# Patient Record
Sex: Female | Born: 1960
Health system: Southern US, Community
[De-identification: ages and names within clinical notes are randomized; demographics above are authoritative.]

---

## 2017-02-04 DIAGNOSIS — J4520 Mild intermittent asthma, uncomplicated: Secondary | ICD-10-CM | POA: Diagnosis not present

## 2017-02-04 DIAGNOSIS — I10 Essential (primary) hypertension: Secondary | ICD-10-CM | POA: Diagnosis not present

## 2017-02-22 DIAGNOSIS — I10 Essential (primary) hypertension: Secondary | ICD-10-CM | POA: Diagnosis not present

## 2017-03-05 ENCOUNTER — Encounter: Payer: Self-pay | Admitting: Nurse Practitioner

## 2017-03-05 ENCOUNTER — Ambulatory Visit: Payer: Self-pay

## 2017-03-05 ENCOUNTER — Ambulatory Visit: Payer: Self-pay | Admitting: Nurse Practitioner

## 2017-03-05 VITALS — BP 115/80 | HR 86 | Temp 97.5°F | Resp 16 | Wt 122.4 lb

## 2017-03-05 DIAGNOSIS — J029 Acute pharyngitis, unspecified: Secondary | ICD-10-CM

## 2017-03-05 DIAGNOSIS — J0110 Acute frontal sinusitis, unspecified: Secondary | ICD-10-CM

## 2017-03-05 MED ORDER — DOXYCYCLINE HYCLATE 100 MG PO TABS: 100.0000 mg | ORAL_TABLET | Freq: Two times a day (BID) | ORAL | 0 refills | Status: DC

## 2017-03-05 NOTE — Patient Instructions (Signed)

## 2017-03-05 NOTE — Progress Notes (Signed)
   Subjective:    Patient ID: Jacqueline Wilkins, female    DOB: 05-02-1960, 57 y.o.   MRN: [id number]  HPI  Patient comes in today c/o cough and congestion with sore throat. Her throat is bothering her the most. headahce and achiness started last night. Denies fever.   Review of Systems  Constitutional: Positive for appetite change. Negative for fever.  HENT: Positive for congestion, ear pain, sinus pressure, sore throat and trouble swallowing.   Respiratory: Negative for cough.   Gastrointestinal: Positive for nausea.  Neurological: Positive for headaches.  Psychiatric/Behavioral: Negative.   All other systems reviewed and are negative.      Objective:   Physical Exam  Constitutional: She is oriented to person, place, and time. She appears well-developed and well-nourished. She appears distressed (mild).  HENT:  Right Ear: Hearing, tympanic membrane, external ear and ear canal normal.  Left Ear: Hearing, tympanic membrane, external ear and ear canal normal.  Nose: Mucosal edema and rhinorrhea present. Right sinus exhibits maxillary sinus tenderness. Right sinus exhibits no frontal sinus tenderness. Left sinus exhibits maxillary sinus tenderness. Left sinus exhibits no frontal sinus tenderness.  Eyes: Pupils are equal, round, and reactive to light.  Neck: Normal range of motion. Neck supple.  Cardiovascular: Normal rate and regular rhythm.  Pulmonary/Chest: Effort normal and breath sounds normal.  Lymphadenopathy:    She has no cervical adenopathy.  Neurological: She is alert and oriented to person, place, and time.  Skin: Skin is warm and dry.  Psychiatric: She has a normal mood and affect. Her behavior is normal. Judgment and thought content normal.   BP 115/80 (BP Location: Right Arm, Patient Position: Sitting, Cuff Size: Normal)   Pulse 86   Temp (!) 97.5 F (36.4 C) (Oral)   Resp 16   Wt 122 lb 6.4 oz (55.5 kg)   SpO2 98%       Assessment & Plan:   1. Acute frontal  sinusitis, recurrence not specified   2. Pharyngitis, unspecified etiology    Meds ordered this encounter  Medications  . doxycycline (VIBRA-TABS) 100 MG tablet    Sig: Take 1 tablet (100 mg total) by mouth 2 (two) times daily. 1 po bid    Dispense:  20 tablet    Refill:  0    Order Specific Question:   Supervising Provider    Answer:   Benay Pillow E [[id number]]   1. Take meds as prescribed 2. Use a cool mist humidifier especially during the winter months and when heat has been humid. 3. Use saline nose sprays frequently 4. Saline irrigations of the nose can be very helpful if done frequently.  * 4X daily for 1 week*  * Use of a nettie pot can be helpful with this. Follow directions with this* 5. Drink plenty of fluids 6. Keep thermostat turn down low 7.For any cough or congestion  Use plain Mucinex- regular strength or max strength is fine   * Children- consult with Pharmacist for dosing 8. For fever or aces or pains- take tylenol or ibuprofen appropriate for age and weight.  * for fevers greater than 101 orally you may alternate ibuprofen and tylenol every  3 hours.   Mary-Margaret Hassell Done, FNP

## 2017-03-09 ENCOUNTER — Telehealth: Payer: Self-pay

## 2017-03-09 NOTE — Telephone Encounter (Signed)
Called to f/u with pt to see how she was feeling since her visit with [place] and she states she is feeling a little better but it is taking things a while to get out of her system.

## 2017-04-12 ENCOUNTER — Other Ambulatory Visit: Payer: Self-pay | Admitting: Physician Assistant

## 2017-04-12 ENCOUNTER — Other Ambulatory Visit (HOSPITAL_COMMUNITY)
Admission: RE | Admit: 2017-04-12 | Discharge: 2017-04-12 | Disposition: A | Discharge status: Home / Self Care | Payer: 59 | Source: Ambulatory Visit | Attending: Family Medicine | Admitting: Family Medicine

## 2017-04-12 DIAGNOSIS — Z01419 Encounter for gynecological examination (general) (routine) without abnormal findings: Secondary | ICD-10-CM | POA: Diagnosis not present

## 2017-04-12 DIAGNOSIS — I10 Essential (primary) hypertension: Secondary | ICD-10-CM | POA: Diagnosis not present

## 2017-04-12 DIAGNOSIS — J4520 Mild intermittent asthma, uncomplicated: Secondary | ICD-10-CM | POA: Diagnosis not present

## 2017-04-12 DIAGNOSIS — M899 Disorder of bone, unspecified: Secondary | ICD-10-CM | POA: Diagnosis not present

## 2017-04-12 DIAGNOSIS — Z0000 Encounter for general adult medical examination without abnormal findings: Secondary | ICD-10-CM | POA: Diagnosis not present

## 2017-04-13 LAB — CYTOLOGY - PAP: Diagnosis: NEGATIVE

## 2017-05-10 ENCOUNTER — Ambulatory Visit: Payer: Self-pay | Admitting: Nurse Practitioner

## 2017-05-10 VITALS — BP 115/80 | HR 72 | Temp 98.4°F | Resp 16 | Wt 119.2 lb

## 2017-05-10 DIAGNOSIS — J019 Acute sinusitis, unspecified: Secondary | ICD-10-CM

## 2017-05-10 MED ORDER — DOXYCYCLINE HYCLATE 100 MG PO TABS: 100.0000 mg | ORAL_TABLET | Freq: Two times a day (BID) | ORAL | 0 refills | Status: AC

## 2017-05-10 MED ORDER — FLUTICASONE PROPIONATE 50 MCG/ACT NA SUSP: 2.0000 | Freq: Every day | NASAL | 0 refills | Status: AC

## 2017-05-10 NOTE — Progress Notes (Signed)
Subjective:  Jacqueline Wilkins is a 57 y.o. female who presents for evaluation of possible sinusitis.  Symptoms include left ear pressure/pain, facial pain, nasal congestion, no fever, non productive cough, post nasal drip, sinus pressure, sinus pain, sneezing and sore throat.  Onset of symptoms was 7 days ago, and has been gradually worsening since that time.  Treatment to date:  decongestants.  High risk factors for influenza complications:  none.  The following portions of the patient's history were reviewed and updated as appropriate:  allergies, current medications and past medical history.  Constitutional: positive for anorexia and fatigue, negative for chills, fevers, malaise and sweats Eyes: negative Ears, nose, mouth, throat, and face: positive for nasal congestion, sore throat and left ear pressure, negative for ear drainage, earaches and hoarseness Respiratory: positive for asthma, cough and sputum, negative for chronic bronchitis, dyspnea on exertion, pneumonia, stridor and wheezing Cardiovascular: negative Gastrointestinal: positive for decreased appetite, nausea, negative for abdominal pain, constipation, diarrhea and vomiting Neurological: positive for headaches, negative for coordination problems, dizziness, paresthesia, speech problems, vertigo and weakness Allergic/Immunologic: positive for hay fever Objective:  BP 115/80 (BP Location: Left Arm, Patient Position: Sitting, Cuff Size: Normal)   Pulse 72   Temp 98.4 F (36.9 C) (Oral)   Resp 16   Wt 119 lb 3.2 oz (54.1 kg)   SpO2 98%  General appearance: alert, cooperative and no distress Head: Normocephalic, without obvious abnormality, atraumatic Eyes: conjunctivae/corneas clear. PERRL, EOM's intact. Fundi benign. Ears: normal TM and external ear canal right ear and abnormal TM left ear - mucoid middle ear fluid Nose: no discharge, turbinates swollen, inflamed, moderate maxillary sinus tenderness left, moderate frontal sinus  tenderness left Throat: lips, mucosa, and tongue normal; teeth and gums normal Lungs: clear to auscultation bilaterally Heart: regular rate and rhythm, S1, S2 normal, no murmur, click, rub or gallop Abdomen: soft, non-tender; bowel sounds normal; no masses,  no organomegaly Pulses: 2+ and symmetric Skin: Skin color, texture, turgor normal. No rashes or lesions Lymph nodes: cervical and submandibular nodes normal Neurologic: Grossly normal    Assessment:  Acute Sinusitis    Plan:  Discussed diagnosis and treatment of sinusitis. Educational material distributed and questions answered. Suggested symptomatic OTC remedies. Supportive care with appropriate antipyretics and fluids. Nasal saline spray for congestion. Doxycycline per orders. Nasal steroids per orders. Follow up as needed.  Meds ordered this encounter  Medications  . doxycycline (VIBRA-TABS) 100 MG tablet    Sig: Take 1 tablet (100 mg total) by mouth 2 (two) times daily for 10 days.    Dispense:  20 tablet    Refill:  0    Order Specific Question:   Supervising Provider    Answer:   Ricard Dillon [[id number]]  . fluticasone (FLONASE) 50 MCG/ACT nasal spray    Sig: Place 2 sprays into both nostrils daily for 10 days.    Dispense:  16 g    Refill:  0    Order Specific Question:   Supervising Provider    Answer:   Ricard Dillon [phone]

## 2017-05-10 NOTE — Patient Instructions (Signed)

## 2017-05-12 ENCOUNTER — Telehealth: Payer: Self-pay

## 2017-05-12 NOTE — Telephone Encounter (Signed)
I left a message to the patient asking to call us back. 

## 2017-05-13 ENCOUNTER — Ambulatory Visit
Admission: RE | Admit: 2017-05-13 | Discharge: 2017-05-13 | Disposition: A | Discharge status: Home / Self Care | Payer: Self-pay | Source: Ambulatory Visit | Attending: Physician Assistant | Admitting: Physician Assistant

## 2017-05-13 ENCOUNTER — Other Ambulatory Visit: Payer: Self-pay | Admitting: Physician Assistant

## 2017-05-13 ENCOUNTER — Ambulatory Visit
Admission: RE | Admit: 2017-05-13 | Discharge: 2017-05-13 | Disposition: A | Discharge status: Home / Self Care | Payer: 59 | Source: Ambulatory Visit | Attending: Physician Assistant | Admitting: Physician Assistant

## 2017-05-13 DIAGNOSIS — M898X9 Other specified disorders of bone, unspecified site: Secondary | ICD-10-CM

## 2017-05-13 DIAGNOSIS — M7591 Shoulder lesion, unspecified, right shoulder: Secondary | ICD-10-CM | POA: Diagnosis not present

## 2017-06-24 ENCOUNTER — Encounter: Payer: Self-pay | Admitting: Nurse Practitioner

## 2017-06-24 ENCOUNTER — Ambulatory Visit: Payer: Self-pay | Admitting: Nurse Practitioner

## 2017-06-24 VITALS — BP 100/80 | HR 61 | Temp 98.6°F | Wt 120.2 lb

## 2017-06-24 DIAGNOSIS — R21 Rash and other nonspecific skin eruption: Secondary | ICD-10-CM

## 2017-06-24 MED ORDER — HYDROCORTISONE 2.5 % EX OINT: TOPICAL_OINTMENT | Freq: Two times a day (BID) | CUTANEOUS | 0 refills | Status: AC

## 2017-06-24 NOTE — Patient Instructions (Signed)

## 2017-06-24 NOTE — Progress Notes (Signed)
Subjective:     Jacqueline Wilkins is a 57 y.o. female who presents for evaluation of a rash involving the bilateral upper extremities. Rash started 2 weeks ago. Lesions are erythematous, and raised in texture. Rash has not changed over time. Rash is pruritic. Associated symptoms: none. Patient denies: abdominal pain, congestion, cough, fever, headache, irritability, myalgia, sore throat and vomiting. Patient has not had contacts with similar rash. Patient has not had new exposures (soaps, lotions, laundry detergents, foods, medications, plants, insects or animals).  Patient takes Zyrtec on a daily basis for seasonal allergies.  The following portions of the patient's history were reviewed and updated as appropriate: allergies, current medications and past medical history.  Review of Systems Constitutional: negative Eyes: negative Cardiovascular: negative Gastrointestinal: negative Integument/breast: positive for pruritus, rash, skin color change and skin lesion(s), negative for changed mole and dryness    Objective:    BP 100/80   Pulse 61   Temp 98.6 F (37 C)   Wt 120 lb 3.2 oz (54.5 kg)   SpO2 99%  General:  alert, cooperative and no distress  Skin:  normal and linear rash to bilateral forearm, rash measures approximately 2cm in length, erythematous, no drainage     Assessment:    contact dermatitis: Unknown Trigger    Plan:    Medications: hydrocortisone.  The patient was also instructed to continue use of the Zyrtec until symptoms improve.  Patient instructed not to scratch the skin.  Patient will return if there is a change in the rash, drainage, streaking up her arm, or other concerns.  Patient education provided.  Patient verbalizes understanding and has no questions at time of discharge. Meds ordered this encounter  Medications  . hydrocortisone 2.5 % ointment    Sig: Apply topically 2 (two) times daily for 10 days. Apply to affected area twice daily until symptoms  improve.    Dispense:  20 g    Refill:  0    Order Specific Question:   Supervising Provider    Answer:   Ricard Dillon [phone]

## 2017-09-15 MED FILL — LISINOPRIL 20 MG TABLET: 20 | 90 days supply | Qty: 90 | Fill #0

## 2017-12-01 ENCOUNTER — Other Ambulatory Visit: Payer: Self-pay | Admitting: Physician Assistant

## 2017-12-01 ENCOUNTER — Ambulatory Visit
Admission: RE | Admit: 2017-12-01 | Discharge: 2017-12-01 | Disposition: A | Discharge status: Home / Self Care | Payer: 59 | Source: Ambulatory Visit | Attending: Physician Assistant | Admitting: Physician Assistant

## 2017-12-01 DIAGNOSIS — M899 Disorder of bone, unspecified: Secondary | ICD-10-CM

## 2017-12-01 DIAGNOSIS — M7591 Shoulder lesion, unspecified, right shoulder: Secondary | ICD-10-CM | POA: Diagnosis not present

## 2018-01-05 DIAGNOSIS — H40053 Ocular hypertension, bilateral: Secondary | ICD-10-CM | POA: Diagnosis not present

## 2018-01-05 DIAGNOSIS — H40013 Open angle with borderline findings, low risk, bilateral: Secondary | ICD-10-CM | POA: Diagnosis not present

## 2018-01-05 DIAGNOSIS — D3132 Benign neoplasm of left choroid: Secondary | ICD-10-CM | POA: Diagnosis not present

## 2018-01-05 DIAGNOSIS — H524 Presbyopia: Secondary | ICD-10-CM | POA: Diagnosis not present

## 2018-01-13 MED FILL — VENTOLIN HFA 90 MCG INHALER: 108 (90 BAS | 25 days supply | Qty: 18 | Fill #0

## 2018-02-13 DIAGNOSIS — R42 Dizziness and giddiness: Secondary | ICD-10-CM | POA: Diagnosis not present

## 2018-02-13 MED FILL — MECLIZINE 25 MG TABLET: 25 | 13 days supply | Qty: 40 | Fill #0

## 2018-02-17 DIAGNOSIS — H8110 Benign paroxysmal vertigo, unspecified ear: Secondary | ICD-10-CM | POA: Diagnosis not present

## 2018-02-22 MED FILL — LISINOPRIL 20 MG TABLET: 20 | 90 days supply | Qty: 90 | Fill #0

## 2018-02-27 MED FILL — VENTOLIN HFA 90 MCG INHALER: 108 (90 BAS | 25 days supply | Qty: 18 | Fill #0

## 2018-03-29 DIAGNOSIS — H40053 Ocular hypertension, bilateral: Secondary | ICD-10-CM | POA: Diagnosis not present

## 2018-03-29 DIAGNOSIS — H40013 Open angle with borderline findings, low risk, bilateral: Secondary | ICD-10-CM | POA: Diagnosis not present

## 2018-05-01 ENCOUNTER — Telehealth: Payer: 59 | Admitting: Family

## 2018-05-01 DIAGNOSIS — R399 Unspecified symptoms and signs involving the genitourinary system: Secondary | ICD-10-CM | POA: Diagnosis not present

## 2018-05-01 MED ORDER — NITROFURANTOIN MONOHYD MACRO 100 MG PO CAPS: 100.0000 mg | ORAL_CAPSULE | Freq: Two times a day (BID) | ORAL | 0 refills | Status: DC

## 2018-05-01 NOTE — Progress Notes (Signed)
We are sorry that you are not feeling well.  Here is how we plan to help!  Based on what you shared with me it looks like you most likely have a simple urinary tract infection.  A UTI (Urinary Tract Infection) is a bacterial infection of the bladder.  Most cases of urinary tract infections are simple to treat but a key part of your care is to encourage you to drink plenty of fluids and watch your symptoms carefully.  I have prescribed MacroBid 100 mg twice a day for 5 days.  Your symptoms should gradually improve. Call us if the burning in your urine worsens, you develop worsening fever, back pain or pelvic pain or if your symptoms do not resolve after completing the antibiotic.  Approximately 5 minutes was spent documenting and reviewing patient's chart.   Urinary tract infections can be prevented by drinking plenty of water to keep your body hydrated.  Also be sure when you wipe, wipe from front to back and don't hold it in!  If possible, empty your bladder every 4 hours.  Your e-visit answers were reviewed by a board certified advanced clinical practitioner to complete your personal care plan.  Depending on the condition, your plan could have included both over the counter or prescription medications.  If there is a problem please reply  once you have received a response from your provider.  Your safety is important to us.  If you have drug allergies check your prescription carefully.    You can use MyChart to ask questions about today's visit, request a non-urgent call back, or ask for a work or school excuse for 24 hours related to this e-Visit. If it has been greater than 24 hours you will need to follow up with your provider, or enter a new e-Visit to address those concerns.   You will get an e-mail in the next two days asking about your experience.  I hope that your e-visit has been valuable and will speed your recovery. Thank you for using e-visits.    

## 2018-05-05 DIAGNOSIS — J4520 Mild intermittent asthma, uncomplicated: Secondary | ICD-10-CM | POA: Diagnosis not present

## 2018-05-05 DIAGNOSIS — I10 Essential (primary) hypertension: Secondary | ICD-10-CM | POA: Diagnosis not present

## 2018-05-05 MED FILL — ALBUTEROL SULFATE HFA 108 (: 108 (90 BAS | 25 days supply | Qty: 9 | Fill #0

## 2018-05-05 MED FILL — LISINOPRIL 20 MG TABLET: 20 | 90 days supply | Qty: 90 | Fill #0

## 2018-06-29 ENCOUNTER — Other Ambulatory Visit: Payer: Self-pay | Admitting: Physician Assistant

## 2018-06-29 DIAGNOSIS — Z1231 Encounter for screening mammogram for malignant neoplasm of breast: Secondary | ICD-10-CM

## 2018-07-28 ENCOUNTER — Ambulatory Visit
Admission: RE | Admit: 2018-07-28 | Discharge: 2018-07-28 | Disposition: A | Discharge status: Home / Self Care | Payer: 59 | Source: Ambulatory Visit | Attending: Physician Assistant | Admitting: Physician Assistant

## 2018-07-28 ENCOUNTER — Other Ambulatory Visit: Payer: Self-pay

## 2018-07-28 DIAGNOSIS — Z1231 Encounter for screening mammogram for malignant neoplasm of breast: Secondary | ICD-10-CM | POA: Diagnosis not present

## 2018-08-07 DIAGNOSIS — E785 Hyperlipidemia, unspecified: Secondary | ICD-10-CM | POA: Diagnosis not present

## 2018-08-07 DIAGNOSIS — I10 Essential (primary) hypertension: Secondary | ICD-10-CM | POA: Diagnosis not present

## 2018-08-18 MED FILL — SHINGRIX 50 MCG SUS: 50 | 1 days supply | Qty: 1 | Fill #0

## 2018-08-25 DIAGNOSIS — M7671 Peroneal tendinitis, right leg: Secondary | ICD-10-CM | POA: Diagnosis not present

## 2018-08-28 DIAGNOSIS — M7671 Peroneal tendinitis, right leg: Secondary | ICD-10-CM | POA: Diagnosis not present

## 2018-08-28 DIAGNOSIS — M6281 Muscle weakness (generalized): Secondary | ICD-10-CM | POA: Diagnosis not present

## 2018-08-28 MED FILL — LISINOPRIL 20 MG TABLET: 20 | 90 days supply | Qty: 90 | Fill #0

## 2018-09-19 MED FILL — ALBUTEROL SULFATE HFA 108 (: 108 (90 BAS | 25 days supply | Qty: 9 | Fill #1

## 2018-09-28 ENCOUNTER — Telehealth: Payer: 59 | Admitting: Physician Assistant

## 2018-09-28 DIAGNOSIS — J0110 Acute frontal sinusitis, unspecified: Secondary | ICD-10-CM

## 2018-09-28 MED FILL — DOXYCYCLINE HYCLATE 100 MG: 100 | 10 days supply | Qty: 20 | Fill #0

## 2018-09-28 NOTE — Progress Notes (Signed)
  We are sorry that you are not feeling well.  Here is how we plan to help!  Based on what you have shared with me it looks like you have sinusitis.  Sinusitis is inflammation and infection in the sinus cavities of the head.  Based on your presentation I believe you most likely have Acute Bacterial or Viral Sinusitis. This is an infection caused by bacteria and is treated with antibiotics. I have prescribed Doxycycline 100mg  by mouth twice a day for 10 days. You may use an oral decongestant such as Mucinex D or if you have glaucoma or high blood pressure use plain Mucinex. Saline nasal spray help and can safely be used as often as needed for congestion.  If you develop worsening sinus pain, fever or notice severe headache and vision changes, or if symptoms are not better after completion of antibiotic, please schedule an appointment with a health care provider.    Sinus infections are not as easily transmitted as other respiratory infection, however we still recommend that you avoid close contact with loved ones, especially the very young and elderly.  Remember to wash your hands thoroughly throughout the day as this is the number one way to prevent the spread of infection!  Home Care:  Only take medications as instructed by your medical team.  Complete the entire course of an antibiotic.  Do not take these medications with alcohol.  A steam or ultrasonic humidifier can help congestion.  You can place a towel over your head and breathe in the steam from hot water coming from a faucet.  Avoid close contacts especially the very young and the elderly.  Cover your mouth when you cough or sneeze.  Always remember to wash your hands.  Get Help Right Away If:  You develop worsening fever or sinus pain.  You develop a severe head ache or visual changes.  Your symptoms persist after you have completed your treatment plan.  Make sure you  Understand these instructions.  Will watch your  condition.  Will get help right away if you are not doing well or get worse.  Your e-visit answers were reviewed by a board certified advanced clinical practitioner to complete your personal care plan.  Depending on the condition, your plan could have included both over the counter or prescription medications.  If there is a problem please reply  once you have received a response from your provider.  Your safety is important to [place].  If you have drug allergies check your prescription carefully.    You can use MyChart to ask questions about today's visit, request a non-urgent call back, or ask for a work or school excuse for 24 hours related to this e-Visit. If it has been greater than 24 hours you will need to follow up with your provider, or enter a new e-Visit to address those concerns.  You will get an e-mail in the next two days asking about your experience.  I hope that your e-visit has been valuable and will speed your recovery. Thank you for using e-visits.   Greater than 5 minutes, yet less than 10 minutes of time have been spent researching, coordinating, and implementing care for this patient today

## 2018-10-09 DIAGNOSIS — E785 Hyperlipidemia, unspecified: Secondary | ICD-10-CM | POA: Diagnosis not present

## 2018-10-09 DIAGNOSIS — I10 Essential (primary) hypertension: Secondary | ICD-10-CM | POA: Diagnosis not present

## 2018-10-09 DIAGNOSIS — R5383 Other fatigue: Secondary | ICD-10-CM | POA: Diagnosis not present

## 2018-10-09 DIAGNOSIS — Z85828 Personal history of other malignant neoplasm of skin: Secondary | ICD-10-CM | POA: Diagnosis not present

## 2018-10-09 DIAGNOSIS — L989 Disorder of the skin and subcutaneous tissue, unspecified: Secondary | ICD-10-CM | POA: Diagnosis not present

## 2018-10-09 MED FILL — ATORVASTATIN 20 MG TABLET: 20 | 90 days supply | Qty: 90 | Fill #0

## 2018-10-20 DIAGNOSIS — Z23 Encounter for immunization: Secondary | ICD-10-CM | POA: Diagnosis not present

## 2018-10-20 MED FILL — SHINGRIX 50 MCG SUS: 50 | 1 days supply | Qty: 1 | Fill #1

## 2018-10-22 ENCOUNTER — Telehealth: Payer: 59 | Admitting: Family

## 2018-10-22 DIAGNOSIS — R399 Unspecified symptoms and signs involving the genitourinary system: Secondary | ICD-10-CM | POA: Diagnosis not present

## 2018-10-22 MED ORDER — NITROFURANTOIN MONOHYD MACRO 100 MG PO CAPS: 100.0000 mg | ORAL_CAPSULE | Freq: Two times a day (BID) | ORAL | 0 refills | Status: DC

## 2018-10-22 NOTE — Progress Notes (Signed)
We are sorry that you are not feeling well.  Here is how we plan to help!  Based on what you shared with me it looks like you most likely have a simple urinary tract infection.  A UTI (Urinary Tract Infection) is a bacterial infection of the bladder.  Most cases of urinary tract infections are simple to treat but a key part of your care is to encourage you to drink plenty of fluids and watch your symptoms carefully.  I have prescribed MacroBid 100 mg twice a day for 5 days.  Your symptoms should gradually improve. Call us if the burning in your urine worsens, you develop worsening fever, back pain or pelvic pain or if your symptoms do not resolve after completing the antibiotic.  Urinary tract infections can be prevented by drinking plenty of water to keep your body hydrated.  Also be sure when you wipe, wipe from front to back and don't hold it in!  If possible, empty your bladder every 4 hours.  Approximately 5 minutes was spent documenting and reviewing patient's chart.    Your e-visit answers were reviewed by a board certified advanced clinical practitioner to complete your personal care plan.  Depending on the condition, your plan could have included both over the counter or prescription medications.  If there is a problem please reply  once you have received a response from your provider.  Your safety is important to [place].  If you have drug allergies check your prescription carefully.    You can use MyChart to ask questions about today's visit, request a non-urgent call back, or ask for a work or school excuse for 24 hours related to this e-Visit. If it has been greater than 24 hours you will need to follow up with your provider, or enter a new e-Visit to address those concerns.   You will get an e-mail in the next two days asking about your experience.  I hope that your e-visit has been valuable and will speed your recovery. Thank you for using e-visits.

## 2018-10-22 NOTE — Addendum Note (Signed)
Addended by: Evelina Dun A on: 10/22/2018 10:20 AM   Modules accepted: Orders

## 2018-11-03 DIAGNOSIS — D225 Melanocytic nevi of trunk: Secondary | ICD-10-CM | POA: Diagnosis not present

## 2018-11-03 DIAGNOSIS — L814 Other melanin hyperpigmentation: Secondary | ICD-10-CM | POA: Diagnosis not present

## 2018-11-03 DIAGNOSIS — D485 Neoplasm of uncertain behavior of skin: Secondary | ICD-10-CM | POA: Diagnosis not present

## 2018-11-03 DIAGNOSIS — L570 Actinic keratosis: Secondary | ICD-10-CM | POA: Diagnosis not present

## 2018-11-03 DIAGNOSIS — L988 Other specified disorders of the skin and subcutaneous tissue: Secondary | ICD-10-CM | POA: Diagnosis not present

## 2018-11-03 DIAGNOSIS — D1801 Hemangioma of skin and subcutaneous tissue: Secondary | ICD-10-CM | POA: Diagnosis not present

## 2018-11-20 MED FILL — LISINOPRIL 20 MG TABLET: 20 | 90 days supply | Qty: 90 | Fill #0

## 2018-12-22 ENCOUNTER — Other Ambulatory Visit: Payer: Self-pay

## 2018-12-22 ENCOUNTER — Ambulatory Visit
Admission: RE | Admit: 2018-12-22 | Discharge: 2018-12-22 | Disposition: A | Discharge status: Home / Self Care | Payer: 59 | Source: Ambulatory Visit | Attending: Family Medicine | Admitting: Family Medicine

## 2018-12-22 ENCOUNTER — Other Ambulatory Visit: Payer: Self-pay | Admitting: Family Medicine

## 2018-12-22 DIAGNOSIS — Q784 Enchondromatosis: Secondary | ICD-10-CM | POA: Diagnosis not present

## 2018-12-22 DIAGNOSIS — D169 Benign neoplasm of bone and articular cartilage, unspecified: Secondary | ICD-10-CM

## 2018-12-22 DIAGNOSIS — L309 Dermatitis, unspecified: Secondary | ICD-10-CM | POA: Diagnosis not present

## 2018-12-22 DIAGNOSIS — L570 Actinic keratosis: Secondary | ICD-10-CM | POA: Diagnosis not present

## 2018-12-25 MED FILL — BETAMETHASONE DP 0.05% CRM: 0.05 | 7 days supply | Qty: 15 | Fill #0

## 2019-01-01 MED FILL — ATORVASTATIN 20 MG TABLET: 20 | 90 days supply | Qty: 90 | Fill #1

## 2019-01-10 DIAGNOSIS — E782 Mixed hyperlipidemia: Secondary | ICD-10-CM | POA: Diagnosis not present

## 2019-01-10 DIAGNOSIS — I10 Essential (primary) hypertension: Secondary | ICD-10-CM | POA: Diagnosis not present

## 2019-01-10 DIAGNOSIS — D169 Benign neoplasm of bone and articular cartilage, unspecified: Secondary | ICD-10-CM | POA: Diagnosis not present

## 2019-01-10 DIAGNOSIS — E785 Hyperlipidemia, unspecified: Secondary | ICD-10-CM | POA: Diagnosis not present

## 2019-01-11 DIAGNOSIS — H04123 Dry eye syndrome of bilateral lacrimal glands: Secondary | ICD-10-CM | POA: Diagnosis not present

## 2019-01-11 DIAGNOSIS — H40053 Ocular hypertension, bilateral: Secondary | ICD-10-CM | POA: Diagnosis not present

## 2019-01-11 DIAGNOSIS — H40013 Open angle with borderline findings, low risk, bilateral: Secondary | ICD-10-CM | POA: Diagnosis not present

## 2019-01-11 DIAGNOSIS — H16103 Unspecified superficial keratitis, bilateral: Secondary | ICD-10-CM | POA: Diagnosis not present

## 2019-01-11 MED FILL — LATANOPROST 0.005% OPTH SOL: 0.005 | 25 days supply | Qty: 3 | Fill #0

## 2019-01-18 ENCOUNTER — Ambulatory Visit (INDEPENDENT_AMBULATORY_CARE_PROVIDER_SITE_OTHER)
Admission: RE | Admit: 2019-01-18 | Discharge: 2019-01-18 | Disposition: A | Discharge status: Home / Self Care | Payer: 59 | Source: Ambulatory Visit

## 2019-01-18 DIAGNOSIS — R300 Dysuria: Secondary | ICD-10-CM

## 2019-01-18 DIAGNOSIS — Z87440 Personal history of urinary (tract) infections: Secondary | ICD-10-CM | POA: Diagnosis not present

## 2019-01-18 DIAGNOSIS — R350 Frequency of micturition: Secondary | ICD-10-CM | POA: Diagnosis not present

## 2019-01-18 MED ORDER — NITROFURANTOIN MONOHYD MACRO 100 MG PO CAPS: 100.0000 mg | ORAL_CAPSULE | Freq: Two times a day (BID) | ORAL | 0 refills | Status: DC

## 2019-01-18 MED FILL — NITROFURANTOIN MONO-MCR 100: 100 | 5 days supply | Qty: 10 | Fill #0

## 2019-01-18 NOTE — Discharge Instructions (Signed)
Treating you for UTI Take the medication as prescribed.  Follow up as needed for continued or worsening symptoms

## 2019-01-18 NOTE — ED Provider Notes (Signed)
Virtual Visit via Video Note:  Jacqueline Wilkins  initiated request for Telemedicine visit with [facility] Urgent Care team. I connected with Jacqueline Wilkins  on 01/18/2019 at 9:41 AM  for a synchronized telemedicine visit using a video enabled HIPPA compliant telemedicine application. I verified that I am speaking with Jacqueline Wilkins  using two identifiers. Orvan July, NP  was physically located in a [facility] Urgent care site and Ashay Barrere was located at a different location.   The limitations of evaluation and management by telemedicine as well as the availability of in-person appointments were discussed. Patient was informed that she  may incur a bill ( including co-pay) for this virtual visit encounter. Jacqueline Wilkins  expressed understanding and gave verbal consent to proceed with virtual visit.     History of Present Illness:Jacqueline Wilkins  is a 59 y.o. female presents with dysuria, urinary frequency, fullness in the bladder x 5 days. Hx recurrent UTI.  Treated a few months back with Macrobid which helped her symptoms.  She has increased her water intake.  Denies any associated abdominal pain, back pain, flank pain, fever, nausea, vomiting. No vaginal discharge, itching or irritation.  No past medical history on file.  Allergies  Allergen Reactions  . Penicillins Rash        Observations/Objective:VITALS: Per patient if applicable, see vitals. GENERAL: Alert, appears well and in no acute distress. HEENT: Atraumatic, conjunctiva clear, no obvious abnormalities on inspection of external nose and ears. NECK: Normal movements of the head and neck. CARDIOPULMONARY: No increased WOB. Speaking in clear sentences. I:E ratio WNL.  MS: Moves all visible extremities without noticeable abnormality. PSYCH: Pleasant and cooperative, well-groomed. Speech normal rate and rhythm. Affect is appropriate. Insight and judgement are appropriate. Attention is  focused, linear, and appropriate.  NEURO: CN grossly intact. Oriented as arrived to appointment on time with no prompting. Moves both UE equally.  SKIN: No obvious lesions, wounds, erythema, or cyanosis noted on face or hands.     Assessment and Plan: We will treat for possible urinary tract infection with Macrobid.  Push fluids   Follow Up Instructions: Recommend follow-up with OB/GYN for any continued or worsening problems    I discussed the assessment and treatment plan with the patient. The patient was provided an opportunity to ask questions and all were answered. The patient agreed with the plan and demonstrated an understanding of the instructions.   The patient was advised to call back or seek an in-person evaluation if the symptoms worsen or if the condition fails to improve as anticipated.     Orvan July, NP  01/18/2019 9:41 AM         Orvan July, NP 01/18/19 1215

## 2019-01-23 DIAGNOSIS — N390 Urinary tract infection, site not specified: Secondary | ICD-10-CM | POA: Diagnosis not present

## 2019-01-25 MED FILL — CIPROFLOXACIN HCL 500 MG TA: 500 | 5 days supply | Qty: 10 | Fill #0

## 2019-01-29 DIAGNOSIS — R5383 Other fatigue: Secondary | ICD-10-CM | POA: Diagnosis not present

## 2019-01-29 DIAGNOSIS — I10 Essential (primary) hypertension: Secondary | ICD-10-CM | POA: Diagnosis not present

## 2019-01-29 DIAGNOSIS — E785 Hyperlipidemia, unspecified: Secondary | ICD-10-CM | POA: Diagnosis not present

## 2019-01-31 MED FILL — LATANOPROST 0.005% OPTH SOL: 0.005 | 25 days supply | Qty: 3 | Fill #1

## 2019-02-13 MED FILL — LISINOPRIL 20 MG TABLET: 20 | 90 days supply | Qty: 90 | Fill #0

## 2019-02-22 DIAGNOSIS — H04121 Dry eye syndrome of right lacrimal gland: Secondary | ICD-10-CM | POA: Diagnosis not present

## 2019-02-22 DIAGNOSIS — H16101 Unspecified superficial keratitis, right eye: Secondary | ICD-10-CM | POA: Diagnosis not present

## 2019-02-22 DIAGNOSIS — H04122 Dry eye syndrome of left lacrimal gland: Secondary | ICD-10-CM | POA: Diagnosis not present

## 2019-02-22 DIAGNOSIS — H16102 Unspecified superficial keratitis, left eye: Secondary | ICD-10-CM | POA: Diagnosis not present

## 2019-02-26 MED FILL — LATANOPROST 0.005% OPTH SOL: 0.005 | 25 days supply | Qty: 3 | Fill #2

## 2019-03-12 MED FILL — LISINOPRIL 20 MG TABLET: 20 | 90 days supply | Qty: 90 | Fill #0

## 2019-03-14 MED FILL — ALBUTEROL SULFATE HFA 108 (: 108 (90 BAS | 75 days supply | Qty: 54 | Fill #0

## 2019-03-20 MED FILL — LATANOPROST 0.005% OPTH SOL: 0.005 | 25 days supply | Qty: 3 | Fill #3

## 2019-04-03 MED FILL — ATORVASTATIN 20 MG TABLET: 20 | 90 days supply | Qty: 90 | Fill #0

## 2019-04-16 MED FILL — LATANOPROST 0.005% OPTH SOL: 0.005 | 25 days supply | Qty: 3 | Fill #4

## 2019-05-14 MED FILL — LATANOPROST 0.005% OPTH SOL: 0.005 | 25 days supply | Qty: 3 | Fill #5

## 2019-06-04 ENCOUNTER — Ambulatory Visit (INDEPENDENT_AMBULATORY_CARE_PROVIDER_SITE_OTHER)
Admission: RE | Admit: 2019-06-04 | Discharge: 2019-06-04 | Disposition: A | Discharge status: Home / Self Care | Payer: 59 | Source: Ambulatory Visit

## 2019-06-04 DIAGNOSIS — R300 Dysuria: Secondary | ICD-10-CM

## 2019-06-04 MED ORDER — NITROFURANTOIN MONOHYD MACRO 100 MG PO CAPS: 100.0000 mg | ORAL_CAPSULE | Freq: Two times a day (BID) | ORAL | 0 refills | Status: AC

## 2019-06-04 MED FILL — NITROFURANTOIN MONO-MCR 100: 100 | 5 days supply | Qty: 10 | Fill #0

## 2019-06-04 MED FILL — LATANOPROST 0.005% OPTH SOL: 0.005 | 25 days supply | Qty: 3 | Fill #6

## 2019-06-04 MED FILL — LISINOPRIL 20 MG TABLET: 20 | 90 days supply | Qty: 90 | Fill #1

## 2019-06-04 NOTE — Discharge Instructions (Signed)
Please contact your primary care provider to provider to provide a urine specimen for testing.    After providing a urine specimen, start the [place].    Follow up with your primary care provider or come here to be seen in person if your symptoms are not improving.

## 2019-06-04 NOTE — ED Provider Notes (Signed)
Virtual Visit via Video Note:  Jacqueline Wilkins  initiated request for Telemedicine visit with [facility] Urgent Care team. I connected with Jacqueline Wilkins  on 06/04/2019 at 11:10 AM  for a synchronized telemedicine visit using a video enabled HIPPA compliant telemedicine application. I verified that I am speaking with Jacqueline Wilkins  using two identifiers. Sharion Balloon, NP  was physically located in a [facility] Urgent care site and Luchana Tudor was located at a different location.   The limitations of evaluation and management by telemedicine as well as the availability of in-person appointments were discussed. Patient was informed that she  may incur a bill ( including co-pay) for this virtual visit encounter. Jacqueline Wilkins  expressed understanding and gave verbal consent to proceed with virtual visit.     History of Present Illness:Jacqueline Wilkins  is a 59 y.o. female presents for evaluation of dysuria, bladder fullness, urgency, suprapubic tenderness, nausea, headache x 1 day.  She denies fever, chills, back pain, vaginal discharge, pelvic pain, rash, lesions, or other symptoms.  She reports a history of UTI; last treated in January 2021.     Allergies  Allergen Reactions  . Penicillins Rash     History reviewed. No pertinent past medical history.   Social History   Tobacco Use  . Smoking status: Never Smoker  . Smokeless tobacco: Never Used  Substance Use Topics  . Alcohol use: Not on file  . Drug use: Not on file   ROS: as stated in HPI.  All other systems reviewed and negative.      Observations/Objective: Physical Exam  VITALS: Patient denies fever. GENERAL: Alert, appears well and in no acute distress. HEENT: Atraumatic. NECK: Normal movements of the head and neck. CARDIOPULMONARY: No increased WOB. Speaking in clear sentences. I:E ratio WNL.  MS: Moves all visible extremities without noticeable abnormality. PSYCH: Pleasant and  cooperative, well-groomed. Speech normal rate and rhythm. Affect is appropriate. Insight and judgement are appropriate. Attention is focused, linear, and appropriate.  NEURO: CN grossly intact. Oriented as arrived to appointment on time with no prompting. Moves both UE equally.  SKIN: No obvious lesions, wounds, erythema, or cyanosis noted on face or hands.   Assessment and Plan:    ICD-10-CM   1. Dysuria  R30.0        Follow Up Instructions: Discussed with patient the need for a urine specimen for testing.  She agrees to arrange this with her PCP.  Treating with Macrobid.  Instructed her to follow-up with her PCP or come here to be seen in person if needed.  Patient agrees to plan of care.      I discussed the assessment and treatment plan with the patient. The patient was provided an opportunity to ask questions and all were answered. The patient agreed with the plan and demonstrated an understanding of the instructions.   The patient was advised to call back or seek an in-person evaluation if the symptoms worsen or if the condition fails to improve as anticipated.      Sharion Balloon, NP  06/04/2019 11:10 AM         Sharion Balloon, NP 06/04/19 1110

## 2019-06-05 DIAGNOSIS — R300 Dysuria: Secondary | ICD-10-CM | POA: Diagnosis not present

## 2019-06-05 DIAGNOSIS — J4520 Mild intermittent asthma, uncomplicated: Secondary | ICD-10-CM | POA: Diagnosis not present

## 2019-06-05 DIAGNOSIS — N3000 Acute cystitis without hematuria: Secondary | ICD-10-CM | POA: Diagnosis not present

## 2019-06-05 MED FILL — SULFAMETHOXAZOLE-TMP DS TAB: 800-160 | 7 days supply | Qty: 14 | Fill #0

## 2019-06-05 MED FILL — MONTELUKAST SOD 10 MG TAB: 10 | 90 days supply | Qty: 90 | Fill #0

## 2019-06-15 ENCOUNTER — Other Ambulatory Visit: Payer: Self-pay | Admitting: Family Medicine

## 2019-06-15 DIAGNOSIS — Z1231 Encounter for screening mammogram for malignant neoplasm of breast: Secondary | ICD-10-CM

## 2019-06-21 DIAGNOSIS — N3000 Acute cystitis without hematuria: Secondary | ICD-10-CM | POA: Diagnosis not present

## 2019-06-26 MED FILL — ATORVASTATIN 20 MG TABLET: 20 | 90 days supply | Qty: 90 | Fill #0

## 2019-07-04 ENCOUNTER — Ambulatory Visit
Admission: RE | Admit: 2019-07-04 | Discharge: 2019-07-04 | Disposition: A | Discharge status: Home / Self Care | Payer: 59 | Source: Ambulatory Visit

## 2019-07-04 ENCOUNTER — Other Ambulatory Visit: Payer: Self-pay

## 2019-07-04 DIAGNOSIS — Z1231 Encounter for screening mammogram for malignant neoplasm of breast: Secondary | ICD-10-CM

## 2019-07-05 DIAGNOSIS — N3000 Acute cystitis without hematuria: Secondary | ICD-10-CM | POA: Diagnosis not present

## 2019-07-05 DIAGNOSIS — R399 Unspecified symptoms and signs involving the genitourinary system: Secondary | ICD-10-CM | POA: Diagnosis not present

## 2019-07-05 DIAGNOSIS — H5713 Ocular pain, bilateral: Secondary | ICD-10-CM | POA: Diagnosis not present

## 2019-07-05 DIAGNOSIS — H16103 Unspecified superficial keratitis, bilateral: Secondary | ICD-10-CM | POA: Diagnosis not present

## 2019-07-05 DIAGNOSIS — H04123 Dry eye syndrome of bilateral lacrimal glands: Secondary | ICD-10-CM | POA: Diagnosis not present

## 2019-07-05 DIAGNOSIS — H40053 Ocular hypertension, bilateral: Secondary | ICD-10-CM | POA: Diagnosis not present

## 2019-07-05 DIAGNOSIS — R350 Frequency of micturition: Secondary | ICD-10-CM | POA: Diagnosis not present

## 2019-07-05 MED FILL — SULFAMETHOXAZOLE-TMP DS TAB: 800-160 | 7 days supply | Qty: 14 | Fill #0

## 2019-07-06 ENCOUNTER — Other Ambulatory Visit: Payer: Self-pay | Admitting: Family Medicine

## 2019-07-06 DIAGNOSIS — R928 Other abnormal and inconclusive findings on diagnostic imaging of breast: Secondary | ICD-10-CM

## 2019-07-18 ENCOUNTER — Ambulatory Visit: Payer: 59

## 2019-07-18 ENCOUNTER — Other Ambulatory Visit: Payer: Self-pay

## 2019-07-18 ENCOUNTER — Ambulatory Visit
Admission: RE | Admit: 2019-07-18 | Discharge: 2019-07-18 | Disposition: A | Discharge status: Home / Self Care | Payer: 59 | Source: Ambulatory Visit | Attending: Family Medicine | Admitting: Family Medicine

## 2019-07-18 DIAGNOSIS — R928 Other abnormal and inconclusive findings on diagnostic imaging of breast: Secondary | ICD-10-CM

## 2019-07-18 DIAGNOSIS — R922 Inconclusive mammogram: Secondary | ICD-10-CM | POA: Diagnosis not present

## 2019-07-24 MED FILL — LATANOPROST 0.005% OPTH SOL: 0.005 | 25 days supply | Qty: 3 | Fill #7

## 2019-08-13 MED FILL — LATANOPROST 0.005% OPTH SOL: 0.005 | 25 days supply | Qty: 3 | Fill #8

## 2019-08-24 DIAGNOSIS — R350 Frequency of micturition: Secondary | ICD-10-CM | POA: Diagnosis not present

## 2019-08-24 DIAGNOSIS — N390 Urinary tract infection, site not specified: Secondary | ICD-10-CM | POA: Diagnosis not present

## 2019-08-24 DIAGNOSIS — R351 Nocturia: Secondary | ICD-10-CM | POA: Diagnosis not present

## 2019-08-28 ENCOUNTER — Other Ambulatory Visit (HOSPITAL_COMMUNITY): Payer: Self-pay | Admitting: Family Medicine

## 2019-08-28 MED FILL — LISINOPRIL 20 MG TABLET: 20 | 90 days supply | Qty: 90 | Fill #0

## 2019-08-31 DIAGNOSIS — N390 Urinary tract infection, site not specified: Secondary | ICD-10-CM | POA: Diagnosis not present

## 2019-08-31 DIAGNOSIS — N139 Obstructive and reflux uropathy, unspecified: Secondary | ICD-10-CM | POA: Diagnosis not present

## 2019-09-03 MED FILL — LATANOPROST 0.005% OPTH SOL: 0.005 | 25 days supply | Qty: 3 | Fill #9

## 2019-09-18 MED FILL — ATORVASTATIN CALCIUM 20 MG: 20 | 90 days supply | Qty: 90 | Fill #1

## 2019-09-24 MED FILL — LATANOPROST 0.005% OPTH SOL: 0.005 | 25 days supply | Qty: 3 | Fill #10

## 2019-10-12 DIAGNOSIS — Z23 Encounter for immunization: Secondary | ICD-10-CM | POA: Diagnosis not present

## 2019-10-15 DIAGNOSIS — N3000 Acute cystitis without hematuria: Secondary | ICD-10-CM | POA: Diagnosis not present

## 2019-10-29 MED FILL — LATANOPROST 0.005% OPTH SOL: 0.005 | 25 days supply | Qty: 3 | Fill #11

## 2019-11-15 ENCOUNTER — Other Ambulatory Visit (HOSPITAL_COMMUNITY): Payer: Self-pay

## 2019-11-15 DIAGNOSIS — H40053 Ocular hypertension, bilateral: Secondary | ICD-10-CM | POA: Diagnosis not present

## 2019-11-20 MED FILL — LATANOPROST 0.005% OPTH SOL: 0.005 | 75 days supply | Qty: 8 | Fill #0

## 2019-11-20 MED FILL — LISINOPRIL 20 MG TABS: 20 | 90 days supply | Qty: 90 | Fill #0

## 2019-11-27 DIAGNOSIS — J45990 Exercise induced bronchospasm: Secondary | ICD-10-CM | POA: Diagnosis not present

## 2019-11-27 DIAGNOSIS — E782 Mixed hyperlipidemia: Secondary | ICD-10-CM | POA: Diagnosis not present

## 2019-11-27 DIAGNOSIS — I10 Essential (primary) hypertension: Secondary | ICD-10-CM | POA: Diagnosis not present

## 2019-11-27 DIAGNOSIS — Z8589 Personal history of malignant neoplasm of other organs and systems: Secondary | ICD-10-CM | POA: Diagnosis not present

## 2019-11-27 DIAGNOSIS — Z85828 Personal history of other malignant neoplasm of skin: Secondary | ICD-10-CM | POA: Diagnosis not present

## 2019-11-27 DIAGNOSIS — H409 Unspecified glaucoma: Secondary | ICD-10-CM | POA: Diagnosis not present

## 2019-12-17 DIAGNOSIS — D169 Benign neoplasm of bone and articular cartilage, unspecified: Secondary | ICD-10-CM | POA: Diagnosis not present

## 2019-12-18 ENCOUNTER — Other Ambulatory Visit (HOSPITAL_COMMUNITY): Payer: Self-pay

## 2019-12-18 MED FILL — ATORVASTATIN CALCIUM 20 MG: 20 | 90 days supply | Qty: 90 | Fill #0

## 2019-12-24 DIAGNOSIS — D225 Melanocytic nevi of trunk: Secondary | ICD-10-CM | POA: Diagnosis not present

## 2019-12-24 DIAGNOSIS — D485 Neoplasm of uncertain behavior of skin: Secondary | ICD-10-CM | POA: Diagnosis not present

## 2019-12-24 DIAGNOSIS — L578 Other skin changes due to chronic exposure to nonionizing radiation: Secondary | ICD-10-CM | POA: Diagnosis not present

## 2019-12-24 DIAGNOSIS — C44311 Basal cell carcinoma of skin of nose: Secondary | ICD-10-CM | POA: Diagnosis not present

## 2019-12-24 DIAGNOSIS — L853 Xerosis cutis: Secondary | ICD-10-CM | POA: Diagnosis not present

## 2019-12-24 DIAGNOSIS — L439 Lichen planus, unspecified: Secondary | ICD-10-CM | POA: Diagnosis not present

## 2019-12-24 DIAGNOSIS — L814 Other melanin hyperpigmentation: Secondary | ICD-10-CM | POA: Diagnosis not present

## 2020-01-26 MED FILL — LATANOPROST 0.005% OPTH SOL: 0.005 | 75 days supply | Qty: 8 | Fill #0

## 2020-01-31 ENCOUNTER — Other Ambulatory Visit (HOSPITAL_COMMUNITY): Payer: Self-pay

## 2020-01-31 DIAGNOSIS — C44311 Basal cell carcinoma of skin of nose: Secondary | ICD-10-CM | POA: Diagnosis not present

## 2020-02-14 DIAGNOSIS — R300 Dysuria: Secondary | ICD-10-CM | POA: Diagnosis not present

## 2020-02-15 DIAGNOSIS — Z48817 Encounter for surgical aftercare following surgery on the skin and subcutaneous tissue: Secondary | ICD-10-CM | POA: Diagnosis not present

## 2020-02-15 MED FILL — LISINOPRIL 20 MG TABS: 20 | 90 days supply | Qty: 90 | Fill #1

## 2020-03-10 MED FILL — ATORVASTATIN CALCIUM 20 MG: 20 | 90 days supply | Qty: 90 | Fill #1

## 2020-03-14 DIAGNOSIS — Z4802 Encounter for removal of sutures: Secondary | ICD-10-CM | POA: Diagnosis not present

## 2020-03-14 DIAGNOSIS — Z48817 Encounter for surgical aftercare following surgery on the skin and subcutaneous tissue: Secondary | ICD-10-CM | POA: Diagnosis not present

## 2020-04-03 ENCOUNTER — Other Ambulatory Visit (HOSPITAL_BASED_OUTPATIENT_CLINIC_OR_DEPARTMENT_OTHER): Payer: Self-pay

## 2020-04-14 ENCOUNTER — Other Ambulatory Visit (HOSPITAL_COMMUNITY): Payer: Self-pay

## 2020-04-14 MED FILL — Latanoprost Ophth Soln 0.005%: OPHTHALMIC | 75 days supply | Qty: 7.5 | Fill #0 | Status: AC

## 2020-04-16 ENCOUNTER — Other Ambulatory Visit (HOSPITAL_COMMUNITY): Payer: Self-pay

## 2020-04-16 DIAGNOSIS — H409 Unspecified glaucoma: Secondary | ICD-10-CM | POA: Diagnosis not present

## 2020-04-16 DIAGNOSIS — E782 Mixed hyperlipidemia: Secondary | ICD-10-CM | POA: Diagnosis not present

## 2020-04-16 DIAGNOSIS — J45990 Exercise induced bronchospasm: Secondary | ICD-10-CM | POA: Diagnosis not present

## 2020-04-16 DIAGNOSIS — H918X9 Other specified hearing loss, unspecified ear: Secondary | ICD-10-CM | POA: Diagnosis not present

## 2020-04-16 DIAGNOSIS — I10 Essential (primary) hypertension: Secondary | ICD-10-CM | POA: Diagnosis not present

## 2020-04-16 DIAGNOSIS — Z85828 Personal history of other malignant neoplasm of skin: Secondary | ICD-10-CM | POA: Diagnosis not present

## 2020-04-23 DIAGNOSIS — H903 Sensorineural hearing loss, bilateral: Secondary | ICD-10-CM | POA: Diagnosis not present

## 2020-04-24 DIAGNOSIS — H903 Sensorineural hearing loss, bilateral: Secondary | ICD-10-CM | POA: Diagnosis not present

## 2020-04-24 DIAGNOSIS — H908 Mixed conductive and sensorineural hearing loss, unspecified: Secondary | ICD-10-CM | POA: Diagnosis not present

## 2020-09-11 ENCOUNTER — Other Ambulatory Visit: Payer: Self-pay

## 2021-07-09 IMAGING — MG DIGITAL SCREENING BILATERAL MAMMOGRAM WITH TOMO AND CAD
8 series · 8 of 24 positions shown · non-contrast
Comparison: Previous exam(s).

CLINICAL DATA: Screening.

EXAM:
DIGITAL SCREENING BILATERAL MAMMOGRAM WITH TOMO AND CAD

[R CC synth-2D]
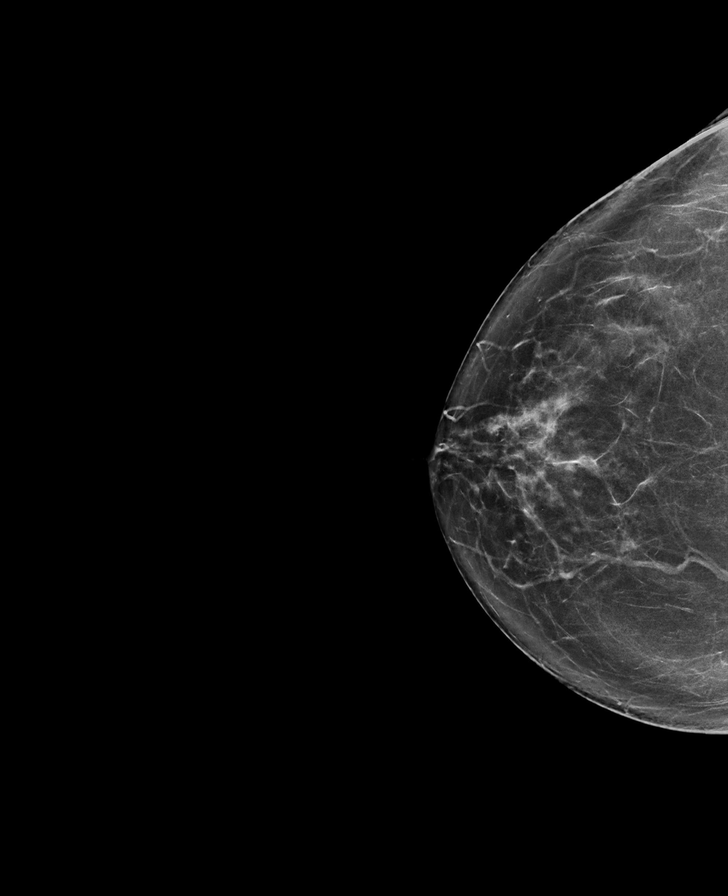

[L CC synth-2D]
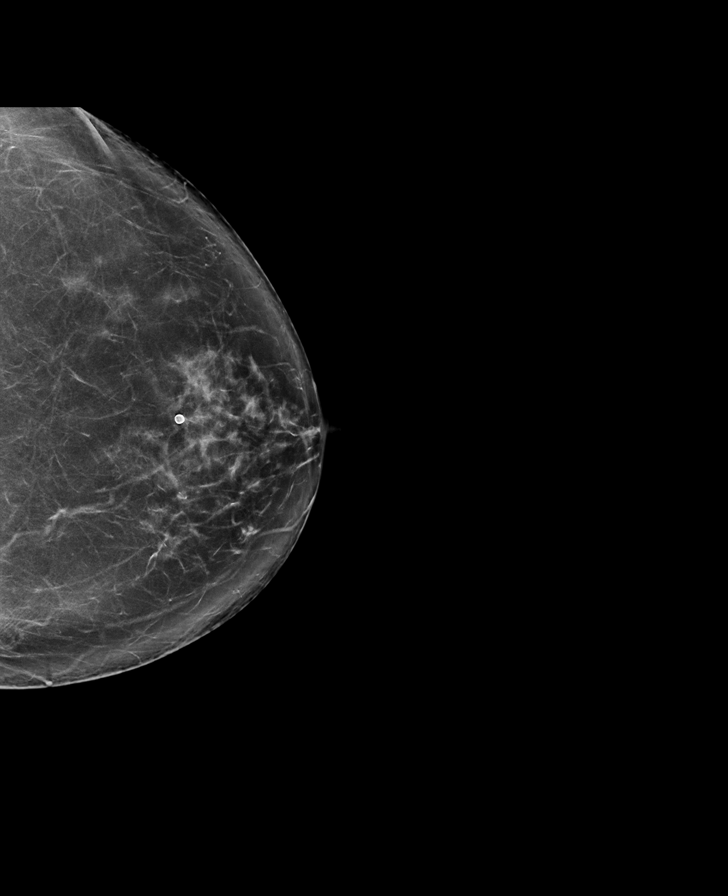

[R MLO synth-2D]
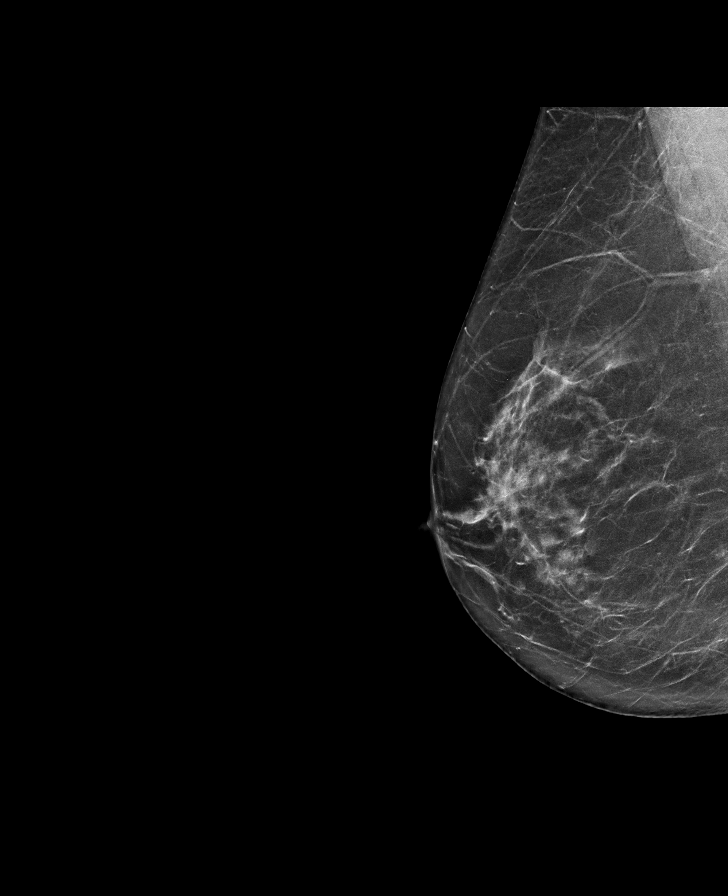

[L MLO synth-2D]
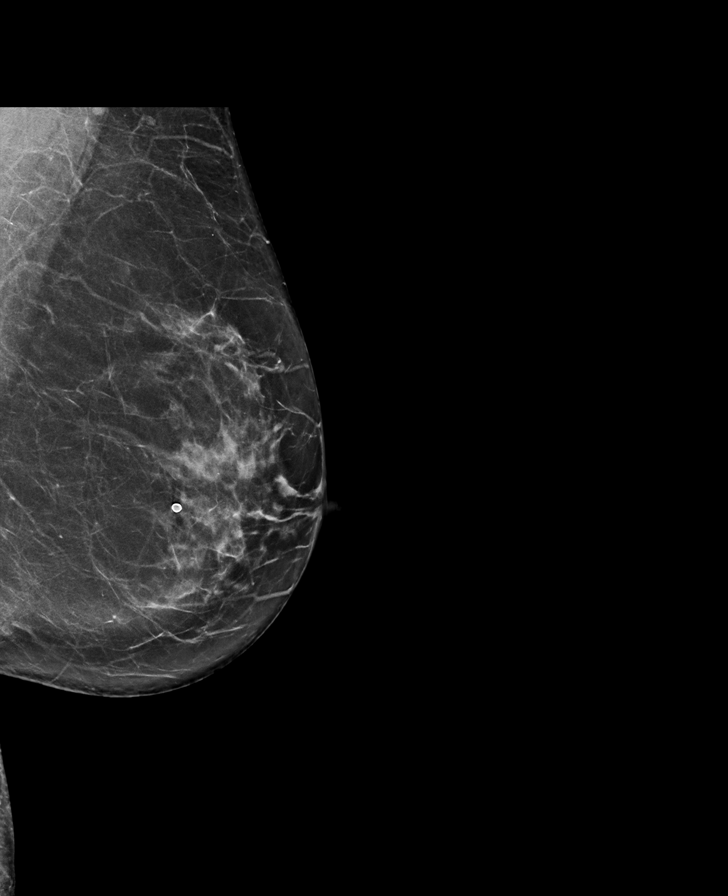

[R MLO tomo · tomo slice 36/71.0]
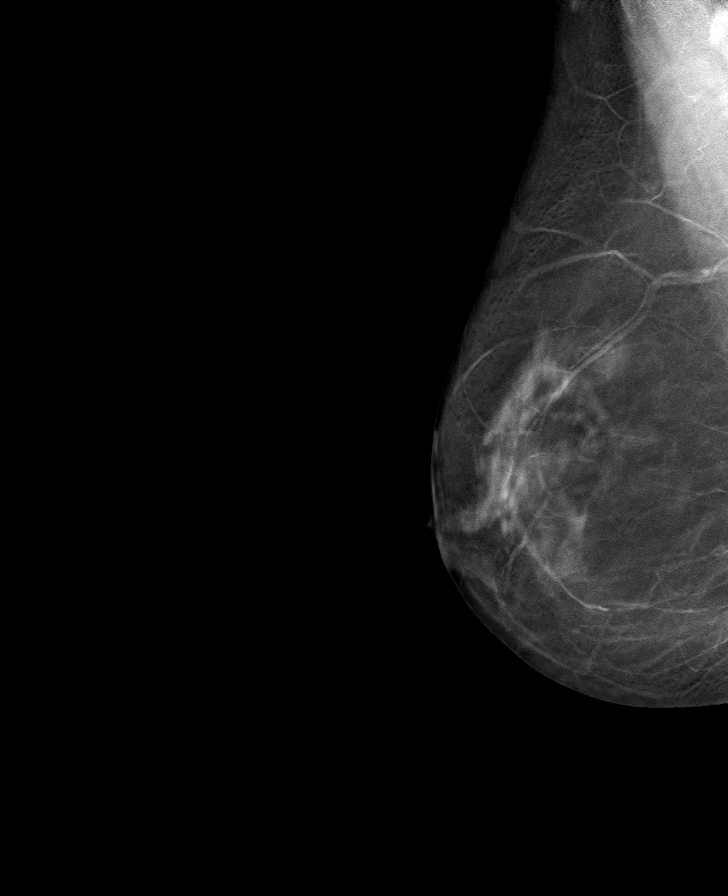

[L MLO tomo · tomo slice 38/75.0]
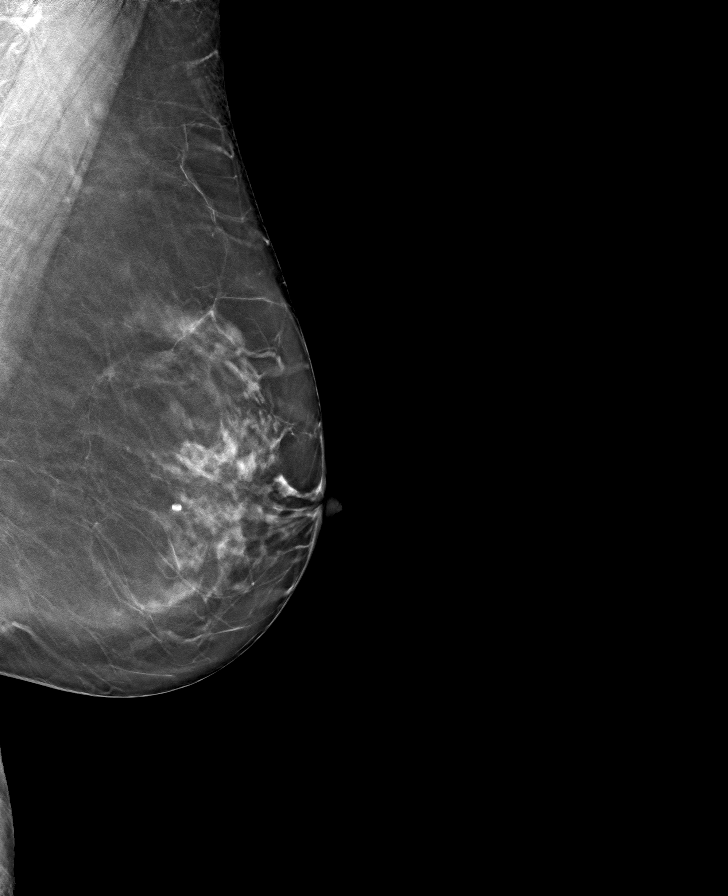

[R CC tomo · tomo slice 37/74.0]
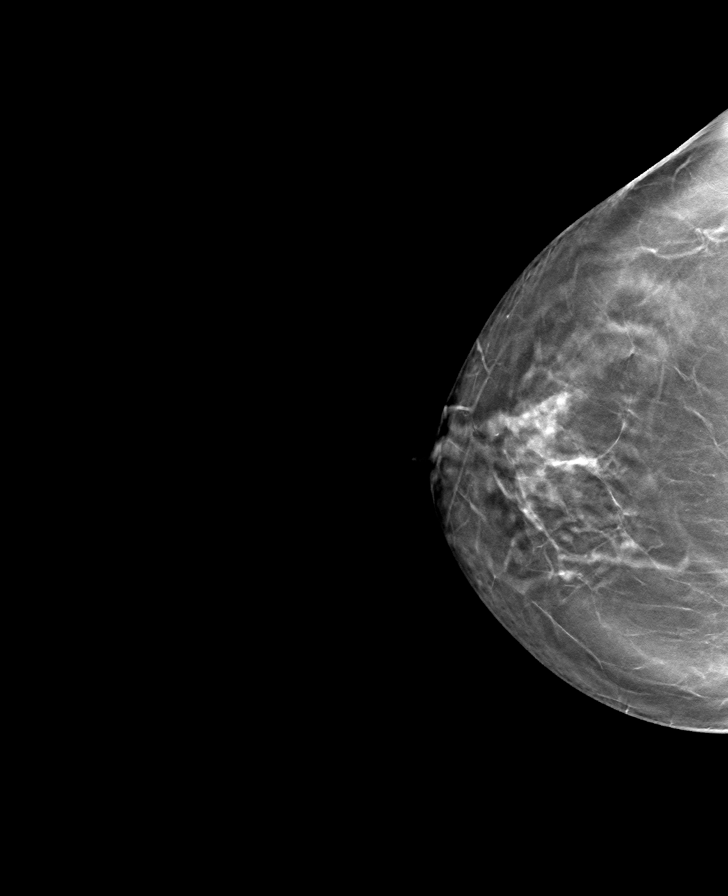

[L CC tomo · tomo slice 40/79.0]
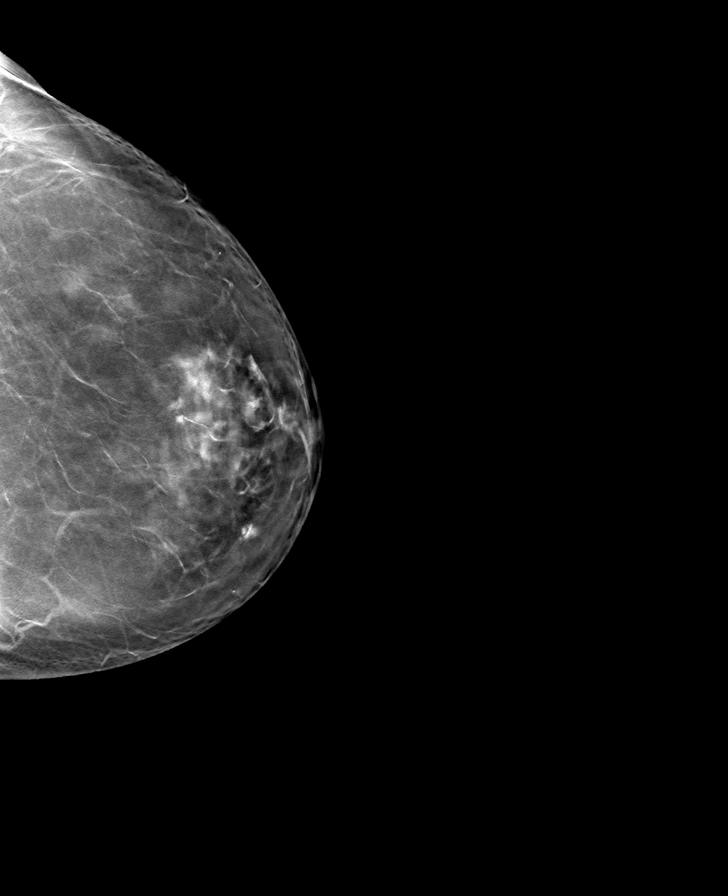

[8 of 24 positions shown; findings below may reference images not displayed]

ACR Breast Density Category b: There are scattered areas of
fibroglandular density.
FINDINGS: There are no findings suspicious for malignancy. Images were
processed with CAD.
IMPRESSION: No mammographic evidence of malignancy. A result letter of this
screening mammogram will be mailed directly to the patient.

RECOMMENDATION:
Screening mammogram in one year. (Code:[id number])

BI-RADS CATEGORY  1: Negative.

## 2022-06-29 IMAGING — MG MM DIGITAL DIAGNOSTIC UNILAT*R* W/ TOMO W/ CAD
4 series · 4 of 12 positions shown · non-contrast
Comparison: Previous exam(s).

CLINICAL DATA: The patient was called back from screening
mammography due to a right breast asymmetry.

EXAM:
DIGITAL DIAGNOSTIC UNILATERAL RIGHT MAMMOGRAM WITH TOMO AND CAD

[R MLO synth-2D]
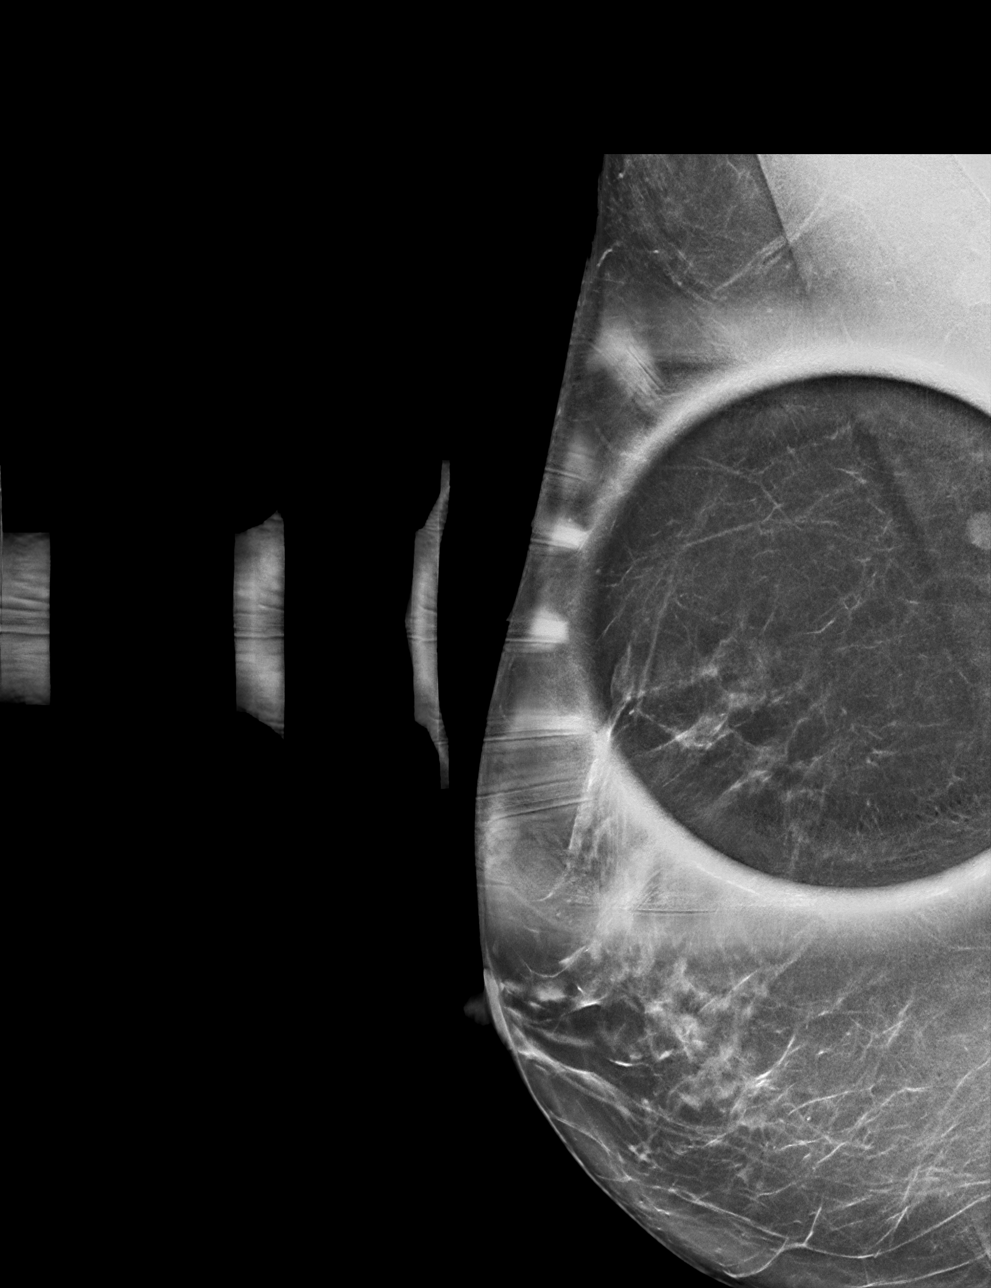

[R ML synth-2D]
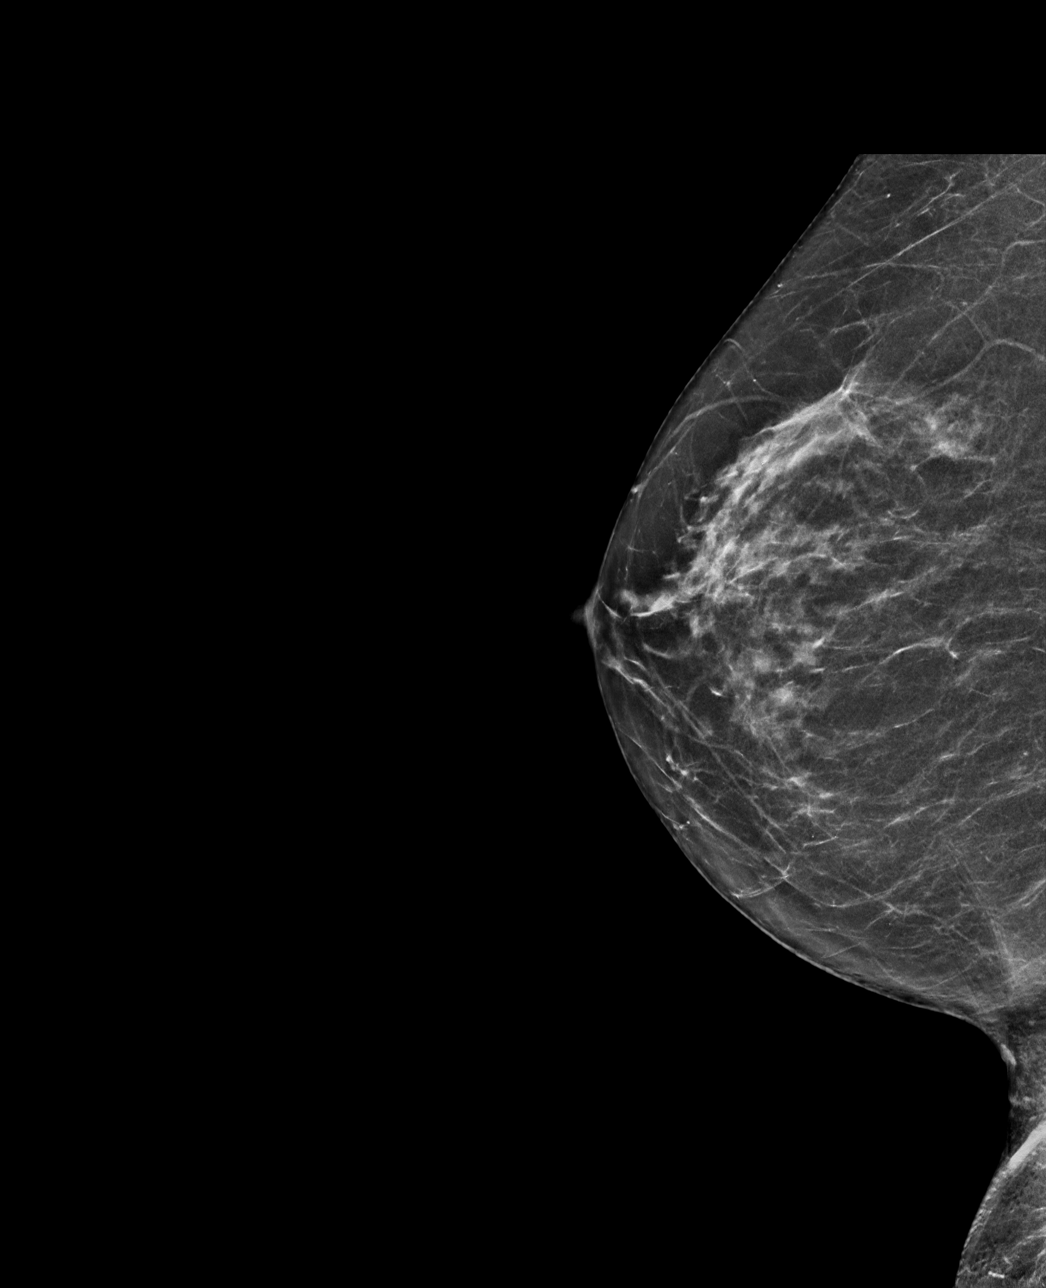

[R MLO tomo · tomo slice 35/68.0]
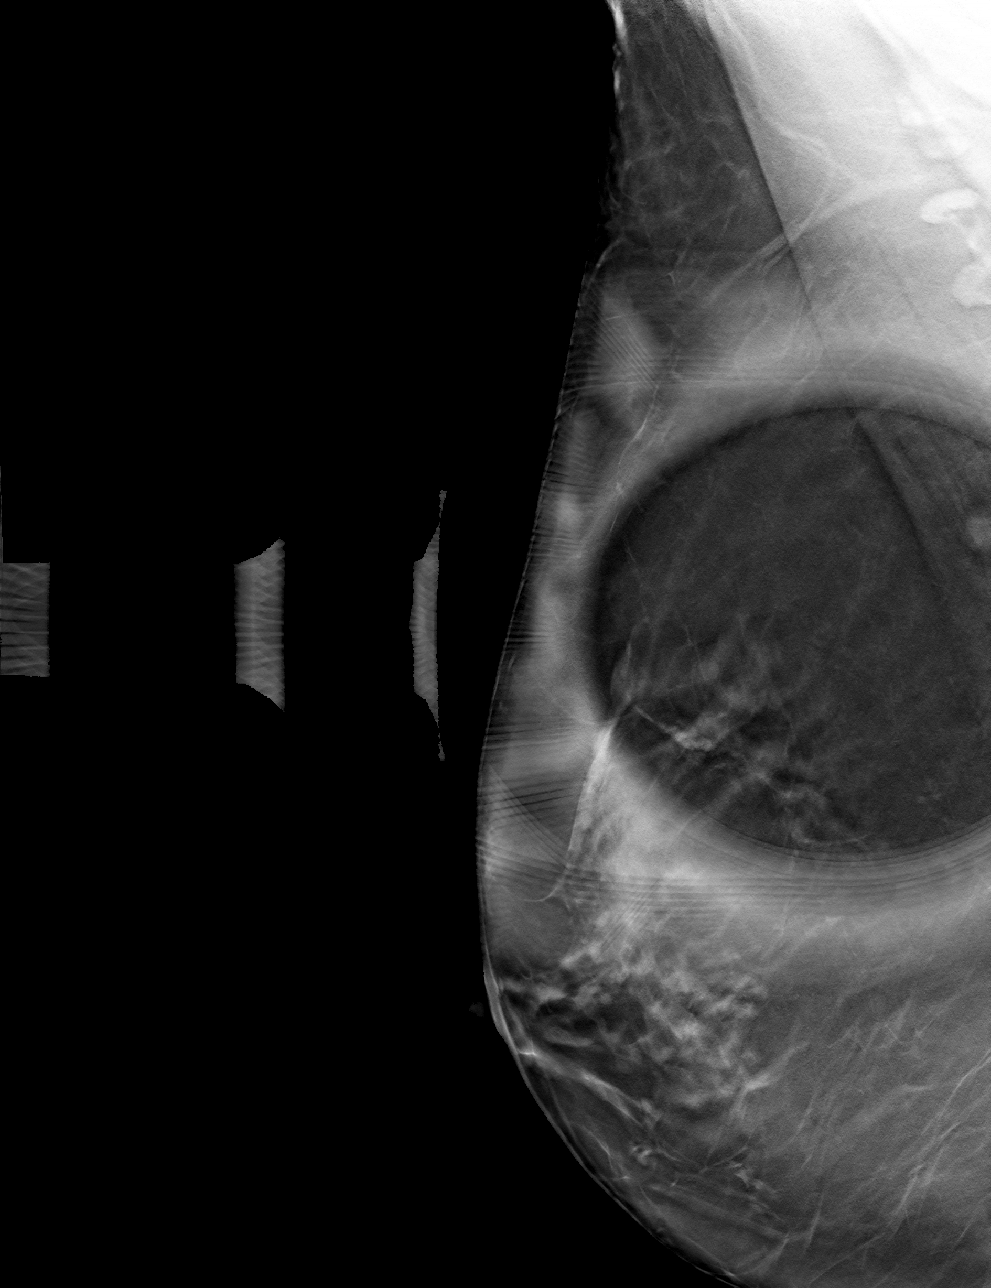

[R ML tomo · tomo slice 33/64.0]
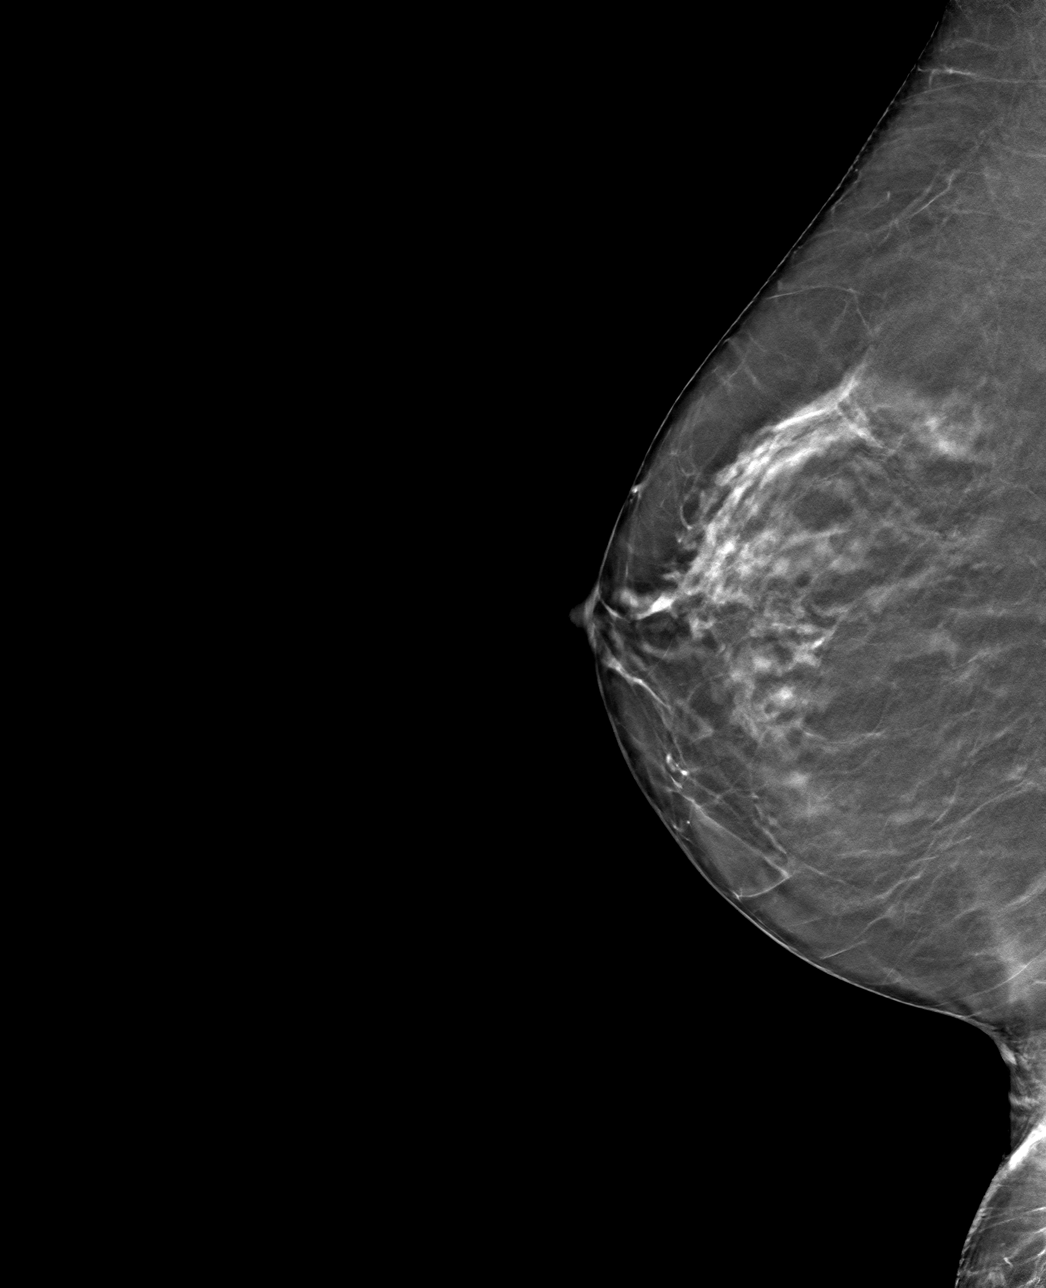

[4 of 12 positions shown; findings below may reference images not displayed]

ACR Breast Density Category c: The breast tissue is heterogeneously
dense, which may obscure small masses.
FINDINGS: The right breast asymmetry resolves on repeat and additional
imaging.

Mammographic images were processed with CAD.
IMPRESSION: No mammographic evidence of malignancy.

RECOMMENDATION:
Annual screening mammography.

I have discussed the findings and recommendations with the patient.
If applicable, a reminder letter will be sent to the patient
regarding the next appointment.

BI-RADS CATEGORY  1: Negative.
